# Patient Record
Sex: Male | Born: 1965 | Race: White | Hispanic: No | Marital: Married | Smoking: Former smoker
Health system: Southern US, Community
[De-identification: ages and names within clinical notes are randomized; demographics above are authoritative.]

## PROBLEM LIST (undated history)

## (undated) HISTORY — PX: LEG SURGERY: SHX1003

---

## 2014-08-29 ENCOUNTER — Emergency Department (HOSPITAL_COMMUNITY)
Admission: EM | Admit: 2014-08-29 | Discharge: 2014-08-29 | Disposition: A | Discharge status: Home / Self Care | Payer: 59 | Attending: Emergency Medicine | Admitting: Emergency Medicine

## 2014-08-29 ENCOUNTER — Encounter (HOSPITAL_COMMUNITY): Payer: Self-pay | Admitting: Emergency Medicine

## 2014-08-29 ENCOUNTER — Emergency Department (HOSPITAL_COMMUNITY): Payer: 59

## 2014-08-29 DIAGNOSIS — S92512A Displaced fracture of proximal phalanx of left lesser toe(s), initial encounter for closed fracture: Secondary | ICD-10-CM | POA: Diagnosis not present

## 2014-08-29 DIAGNOSIS — W2201XA Walked into wall, initial encounter: Secondary | ICD-10-CM | POA: Diagnosis not present

## 2014-08-29 DIAGNOSIS — Y9289 Other specified places as the place of occurrence of the external cause: Secondary | ICD-10-CM | POA: Diagnosis not present

## 2014-08-29 DIAGNOSIS — Y998 Other external cause status: Secondary | ICD-10-CM | POA: Insufficient documentation

## 2014-08-29 DIAGNOSIS — Y9389 Activity, other specified: Secondary | ICD-10-CM | POA: Insufficient documentation

## 2014-08-29 DIAGNOSIS — S92502A Displaced unspecified fracture of left lesser toe(s), initial encounter for closed fracture: Secondary | ICD-10-CM

## 2014-08-29 DIAGNOSIS — S99922A Unspecified injury of left foot, initial encounter: Secondary | ICD-10-CM | POA: Diagnosis present

## 2014-08-29 MED ORDER — CEPHALEXIN 500 MG PO CAPS: 500.0000 mg | ORAL_CAPSULE | Freq: Two times a day (BID) | ORAL | Status: DC

## 2014-08-29 NOTE — ED Notes (Addendum)
Pt states that he was trying to get his kids to school yesterday and accidentally kicked a wall with his rt foot.  Rt second digit swelling, redness, bruising, and pain.  Pt ambulatory to room w/o limp.

## 2014-08-29 NOTE — Discharge Instructions (Signed)
You have an avulsion fracture at the base of your third toe.  Please follow instruction below for care.  You do have an abrasion to your toe, if after the bruising subside but you notice signs of infection including increasing pain, warm to the touch, pus drainage then take Keflex as prescribed for the full duration.  Take ibuprofen or tylenol as needed for pain.  Wear Postop shoe for stability and support.  Return if your condition worsen.  Toe Fracture Your caregiver has diagnosed you as having a fractured toe. A toe fracture is a break in the bone of a toe. "Buddy taping" is a way of splinting your broken toe, by taping the broken toe to the toe next to it. This "buddy taping" will keep the injured toe from moving beyond normal range of motion. Buddy taping also helps the toe heal in a more normal alignment. It may take 6 to 8 weeks for the toe injury to heal. HOME CARE INSTRUCTIONS   Leave your toes taped together for as long as directed by your caregiver or until you see a doctor for a follow-up examination. You can change the tape after bathing. Always use a small piece of gauze or cotton between the toes when taping them together. This will help the skin stay dry and prevent infection.  Apply ice to the injury for 15-20 minutes each hour while awake for the first 2 days. Put the ice in a plastic bag and place a towel between the bag of ice and your skin.  After the first 2 days, apply heat to the injured area. Use heat for the next 2 to 3 days. Place a heating pad on the foot or soak the foot in warm water as directed by your caregiver.  Keep your foot elevated as much as possible to lessen swelling.  Wear sturdy, supportive shoes. The shoes should not pinch the toes or fit tightly against the toes.  Your caregiver may prescribe a rigid shoe if your foot is very swollen.  Your may be given crutches if the pain is too great and it hurts too much to walk.  Only take over-the-counter or  prescription medicines for pain, discomfort, or fever as directed by your caregiver.  If your caregiver has given you a follow-up appointment, it is very important to keep that appointment. Not keeping the appointment could result in a chronic or permanent injury, pain, and disability. If there is any problem keeping the appointment, you must call back to this facility for assistance. SEEK MEDICAL CARE IF:   You have increased pain or swelling, not relieved with medications.  The pain does not get better after 1 week.  Your injured toe is cold when the others are warm. SEEK IMMEDIATE MEDICAL CARE IF:   The toe becomes cold, numb, or white.  The toe becomes hot (inflamed) and red. Document Released: 08/27/2000 Document Revised: 11/22/2011 Document Reviewed: 04/15/2008 [facility] Patient Information 2015 [employer] [place]. This information is not intended to replace advice given to you by your health care provider. Make sure you discuss any questions you have with your health care provider.

## 2014-08-29 NOTE — ED Provider Notes (Signed)
CSN: [id number]     Arrival date & time 08/29/14  0915 History   First MD Initiated Contact with Patient 08/29/14 [id number]     Chief Complaint  Patient presents with  . Toe Pain     (Consider location/radiation/quality/duration/timing/severity/associated sxs/prior Treatment) HPI   48 year old male presents for evaluation of toe injury. Patient reports yesterday morning he accidentally stump his third toe on his left foot against the corner of the wall. Report acute onset of sharp pain to the affected area and since then he noticed that his toe is bruised and swollen. Aside from ice and buddy tape no other specific treatment tried. He is not on any blood thinner medication. He denies any other injury. Denies any ankle pain or numbness. Patient has no other complaint.  Pt able to ambulate.    History reviewed. No pertinent past medical history. Past Surgical History  Procedure Laterality Date  . Leg surgery     History reviewed. No pertinent family history. History  Substance Use Topics  . Smoking status: Never Smoker   . Smokeless tobacco: Not on file  . Alcohol Use: No    Review of Systems  Constitutional: Negative for fever.  Skin: Positive for wound.  Neurological: Negative for numbness.      Allergies  Review of patient's allergies indicates no known allergies.  Home Medications   Prior to Admission medications   Not on File   BP 125/83 mmHg  Pulse 62  Temp(Src) 98.2 F (36.8 C) (Oral)  Resp 18  SpO2 100% Physical Exam  Constitutional: He appears well-developed and well-nourished. No distress.  HENT:  Head: Atraumatic.  Eyes: Conjunctivae are normal.  Neck: Normal range of motion. Neck supple.  Musculoskeletal: He exhibits tenderness (L foot: 3rd toe with diffused ecchymosis to PIP and MTP with tenderness to palpation but no crepitus.  brisk cap refill to toe, with erythema extending to distal foot.  no FB noted. small abrasion noted to medial phalanx.  pedal  pulse palpable.  ).  Neurological: He is alert.  Skin: No rash noted.  Psychiatric: He has a normal mood and affect.    ED Course  Procedures (including critical care time)  10:00 AM Patient with an apparent left third toe injury, suspect fracture at the MTP. X-ray ordered, pain medication offered however patient declined. Otherwise patient is neurovascularly intact.  Pt does have a small abrasion to his skin.  There are associated redness to the toe but likely due to bruising.  Low suspicion for cellulitis at this time, however pt made aware to monitor closely and follow up with his PCP if notice signs of infection.    10:19 AM Xray demonstrates a small avulsion fx at the base of the third proximal phalanx.  This is a closed injury.  Toe will be buddy tape, postop shoe provided for stability.  Ortho referral as needed.    Labs Review Labs Reviewed - No data to display  Imaging Review Dg Foot Complete Right  08/29/2014   CLINICAL DATA:  Foot injury yesterday.  Bruising.  Pain third toe  EXAM: RIGHT FOOT COMPLETE - 3+ VIEW  COMPARISON:  None.  FINDINGS: Small avulsion fracture base of the third proximal phalanx which appears acute. No other fracture.  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 Calcaneal spurring  IMPRESSION: Small avulsion fracture base of the third proximal phalanx.   Electronically Signed   By: Marlan Palauharles  Clark M.D.   On:  08/29/2014 10:08     EKG Interpretation None      MDM   Final diagnoses:  Fracture of third toe, left, closed, initial encounter    BP 125/83 mmHg  Pulse 62  Temp(Src) 98.2 F (36.8 C) (Oral)  Resp 18  SpO2 100%  I have reviewed nursing notes and vital signs. I personally reviewed the imaging tests through PACS system  I reviewed available ER/hospitalization records thought the EMR     Fayrene HelperBowie Toneisha Savary, PA-C 08/29/14 1023  Glynn OctaveStephen Rancour, MD 08/29/14 [phone]

## 2014-08-29 NOTE — ED Notes (Signed)
Patient states he hit his right foot on a door jamb yesterday.  Patient's 3rd toe is bruised and swollen.  He was wearing a sock and a slipper when he hit his foot, but he does have an area of broken skin on the medial left third toe.  Patient can wiggle all digits on the foot and is able to ambulate.  Pulses are present in LLE and skin is warm and dry.

## 2015-01-03 ENCOUNTER — Encounter: Payer: Self-pay | Admitting: Internal Medicine

## 2015-02-03 ENCOUNTER — Ambulatory Visit (AMBULATORY_SURGERY_CENTER): Payer: Self-pay | Admitting: *Deleted

## 2015-02-03 VITALS — Ht 73.0 in | Wt 194.2 lb

## 2015-02-03 DIAGNOSIS — Z1211 Encounter for screening for malignant neoplasm of colon: Secondary | ICD-10-CM

## 2015-02-03 MED ORDER — MOVIPREP 100 G PO SOLR: 1.0000 | Freq: Once | ORAL | Status: DC

## 2015-02-03 NOTE — Progress Notes (Signed)
Denies allergies to eggs or soy products. Denies complications with sedation or anesthesia. Denies O2 use. Denies use of diet or weight loss medications.  Emmi instructions given for colonoscopy.  

## 2015-02-17 ENCOUNTER — Encounter: Payer: Self-pay | Admitting: Internal Medicine

## 2015-02-21 ENCOUNTER — Ambulatory Visit (AMBULATORY_SURGERY_CENTER): Payer: 59 | Admitting: Internal Medicine

## 2015-02-21 ENCOUNTER — Encounter: Payer: Self-pay | Admitting: Internal Medicine

## 2015-02-21 VITALS — BP 132/83 | HR 59 | Temp 98.0°F | Resp 18 | Ht 73.0 in | Wt 194.0 lb

## 2015-02-21 DIAGNOSIS — Z1211 Encounter for screening for malignant neoplasm of colon: Secondary | ICD-10-CM

## 2015-02-21 MED ORDER — SODIUM CHLORIDE 0.9 % IV SOLN: 500.0000 mL | INTRAVENOUS | Status: DC

## 2015-02-21 NOTE — Progress Notes (Signed)
No problems noted in the recovery room. maw 

## 2015-02-21 NOTE — Patient Instructions (Addendum)
YOU HAD AN ENDOSCOPIC PROCEDURE TODAY AT THE [facility]:   Refer to the procedure report that was given to you for any specific questions about what was found during the examination.  If the procedure report does not answer your questions, please call your gastroenterologist to clarify.  If you requested that your care partner not be given the details of your procedure findings, then the procedure report has been included in a sealed envelope for you to review at your convenience later.  YOU SHOULD EXPECT: Some feelings of bloating in the abdomen. Passage of more gas than usual.  Walking can help get rid of the air that was put into your GI tract during the procedure and reduce the bloating. If you had a lower endoscopy (such as a colonoscopy or flexible sigmoidoscopy) you may notice spotting of blood in your stool or on the toilet paper. If you underwent a bowel prep for your procedure, you may not have a normal bowel movement for a few days.  Please Note:  You might notice some irritation and congestion in your nose or some drainage.  This is from the oxygen used during your procedure.  There is no need for concern and it should clear up in a day or so.  SYMPTOMS TO REPORT IMMEDIATELY:   Following lower endoscopy (colonoscopy or flexible sigmoidoscopy):  Excessive amounts of blood in the stool  Significant tenderness or worsening of abdominal pains  Swelling of the abdomen that is new, acute  Fever of 100F or higher   For urgent or emergent issues, a gastroenterologist can be reached at any hour by calling [phone].   DIET: Your first meal following the procedure should be a small meal and then it is ok to progress to your normal diet. Heavy or fried foods are harder to digest and may make you feel nauseous or bloated.  Likewise, meals heavy in dairy and vegetables can increase bloating.  Drink plenty of fluids but you should avoid alcoholic beverages for 24  hours.  ACTIVITY:  You should plan to take it easy for the rest of today and you should NOT DRIVE or use heavy machinery until tomorrow (because of the sedation medicines used during the test).    FOLLOW UP: Our staff will call the number listed on your records the next business day following your procedure to check on you and address any questions or concerns that you may have regarding the information given to you following your procedure. If we do not reach you, we will leave a message.  However, if you are feeling well and you are not experiencing any problems, there is no need to return our call.  We will assume that you have returned to your regular daily activities without incident.  If any biopsies were taken you will be contacted by phone or by letter within the next 1-3 weeks.  Please call us at [phone] if you have not heard about the biopsies in 3 weeks.    SIGNATURES/CONFIDENTIALITY: You and/or your care partner have signed paperwork which will be entered into your electronic medical record.  These signatures attest to the fact that that the information above on your After Visit Summary has been reviewed and is understood.  Full responsibility of the confidentiality of this discharge information lies with you and/or your care-partner.    Handouts were given to your care partner on  diverticulosis and a high fiber diet with liberal fluid intake You may resume   your current medications today. Please call if any questions or concerns.   

## 2015-02-21 NOTE — Progress Notes (Signed)
Pt. Prefers no sedation. I will stand by should patient change his mind.

## 2015-02-21 NOTE — Progress Notes (Signed)
To recovery, report to Willis, RN, VSS 

## 2015-02-21 NOTE — Op Note (Signed)
[facility] [address] [place], [address]   COLONOSCOPY PROCEDURE REPORT  PATIENT: Reginald Buchanan, Reginald Buchanan  MR#: [id number] BIRTHDATE: [birth year] , 48  yrs. old GENDER: male ENDOSCOPIST: Roxy Cedar, MD REFERRED BY:.Direct Self PROCEDURE DATE:  02/21/2015 PROCEDURE:   Colonoscopy, screening First Screening Colonoscopy - Avg.  risk and is 50 yrs.  old or older Yes.  Prior Negative Screening - Now for repeat screening. N/A  History of Adenoma - Now for follow-up colonoscopy & has been > or = to 3 yrs.  N/A  Recommend repeat exam, <10 yrs? No ASA CLASS:   Class I INDICATIONS:Screening for colonic neoplasia and Colorectal Neoplasm Risk Assessment for this procedure is average risk. MEDICATIONS: no sedation per patient request  DESCRIPTION OF PROCEDURE:   After the risks benefits and alternatives of the procedure were thoroughly explained, informed consent was obtained.  The digital rectal exam revealed no abnormalities of the rectum.   The LB [id number]  endoscope was introduced through the anus and advanced to the cecum, which was identified by both the appendix and ileocecal valve. No adverse events experienced.   The quality of the prep was excellent. (MoviPrep was used)  The instrument was then slowly withdrawn as the colon was fully examined. Estimated blood loss is zero unless otherwise noted in this procedure report.      COLON FINDINGS: There was mild diverticulosis, a few tiny diverticula, in the sigmoid colon.   The examination was otherwise normal. No polyps, inflammation, or other abnormalities.. Retroflexed views revealed internal hemorrhoids. The time to cecum = 3.1 Withdrawal time = 10.3   The scope was withdrawn and the procedure completed.  COMPLICATIONS: There were no immediate complications.  ENDOSCOPIC IMPRESSION: 1.   Mild diverticulosis was noted in the sigmoid colon 2.   The examination was otherwise  normal  RECOMMENDATIONS: 1. Continue current colorectal screening recommendations for "routine risk" patients with a repeat colonoscopy in 10 years.  eSigned:  Roxy Cedar, MD 02/21/2015 11:34 AM   cc: The Patient   ; Alma Couzens,MD

## 2015-02-24 ENCOUNTER — Telehealth: Payer: Self-pay | Admitting: Emergency Medicine

## 2015-02-24 NOTE — Telephone Encounter (Signed)
identifier noted, left message f/u

## 2015-02-26 ENCOUNTER — Encounter: Payer: Self-pay | Admitting: *Deleted

## 2015-02-28 ENCOUNTER — Ambulatory Visit (INDEPENDENT_AMBULATORY_CARE_PROVIDER_SITE_OTHER): Payer: 59 | Admitting: Cardiology

## 2015-02-28 ENCOUNTER — Encounter: Payer: Self-pay | Admitting: Cardiology

## 2015-02-28 VITALS — BP 108/80 | HR 51 | Ht 73.0 in | Wt 186.4 lb

## 2015-02-28 DIAGNOSIS — R001 Bradycardia, unspecified: Secondary | ICD-10-CM | POA: Diagnosis not present

## 2015-02-28 DIAGNOSIS — R0609 Other forms of dyspnea: Secondary | ICD-10-CM

## 2015-02-28 NOTE — Patient Instructions (Signed)
Medication Instructions:  None  Labwork: None  Testing/Procedures: Your physician has requested that you have an exercise tolerance test. For further information please visit [email or web address] Please also follow instruction sheet, as given.   Your physician has requested that you have an echocardiogram. Echocardiography is a painless test that uses sound waves to create images of your heart. It provides your doctor with information about the size and shape of your heart and how well your heart's chambers and valves are working. This procedure takes approximately one hour. There are no restrictions for this procedure.  Follow-Up: Your physician recommends that you schedule a follow-up appointment AS NEEDED with Dr. Mayford Knife pending your study results.  Any Other Special Instructions Will Be Listed Below (If Applicable).

## 2015-02-28 NOTE — Progress Notes (Signed)
Cardiology Office Note   Date:  02/28/2015   ID:  Reginald Buchanan, DOB 18-Aug-1966, MRN [id number]  PCP:  Manson Passey, MD    No chief complaint on file.     History of Present Illness: Reginald Buchanan is a 49 y.o. male who presents for evaluation of SOB.  He says that he walks 10 miles a day but when he tries to run he has to stop after running a 1/4 mile because he gets SOB and has to go back to walking.  He can bike for 40 miles at a normal pace but going up a hill he has to slow down due to SOB.  He has always been a walker and has just recently started to run.  He has never run before.  He denies any chest pain or pressure.  He denies any LE edema, dizziness, palpitations, weakness, fatigue or syncope.  He is a former smoker up to 1ppd for 20 year.  He is adopted so he does not know his family history.      History reviewed. No pertinent past medical history.  Past Surgical History  Procedure Laterality Date  . Leg surgery       No current outpatient prescriptions on file.   No current facility-administered medications for this visit.    Allergies:   Review of patient's allergies indicates no known allergies.    Social History:  The patient  reports that he quit smoking about 3 years ago. He has never used smokeless tobacco. He reports that he does not drink alcohol or use illicit drugs.   Family History:  The patient's family history is negative for Colon cancer. He was adopted.    ROS:  Please see the history of present illness.   Otherwise, review of systems are positive for none.   All other systems are reviewed and negative.    PHYSICAL EXAM: VS:  BP 108/80 mmHg  Pulse 51  Ht  (1.854 m)  Wt 186 lb 6.4 oz (84.55 kg)  BMI 24.60 kg/m2 , BMI Body mass index is 24.6 kg/(m^2). GEN: Well nourished, well developed, in no acute distress HEENT: normal Neck: no JVD, carotid bruits, or masses Cardiac: RRR; no murmurs, rubs, or gallops,no edema    Respiratory:  clear to auscultation bilaterally, normal work of breathing GI: soft, nontender, nondistended, + BS MS: no deformity or atrophy Skin: warm and dry, no rash Neuro:  Strength and sensation are intact Psych: euthymic mood, full affect   EKG:  EKG is ordered today. The ekg ordered today demonstrates sinus bradycardia at 51bpm with no ST changes   Recent Labs: No results found for requested labs within last 365 days.    Lipid Panel No results found for: CHOL, TRIG, HDL, CHOLHDL, VLDL, LDLCALC, LDLDIRECT    Wt Readings from Last 3 Encounters:  02/28/15 186 lb 6.4 oz (84.55 kg)  02/21/15 194 lb (87.998 kg)  02/03/15 194 lb 3.2 oz (88.089 kg)       ASSESSMENT AND PLAN:  1.  DOE that occurs only with running.  He can walk 20 miles and bike with no problems.  He just started running and that is when he started noticing the SOB.  He denies any chest pain.  His EKG is normal.  His only CRF is remote history of smoking.  I will get an ETT to rule out CAD and assess  his chronotropic response to exercise.  I will check a 2D echo to assess LVF.  I have stressed the importance to him of establishing with a PCP for routine medical care 2.  Asymptomatic bradycardia   Current medicines are reviewed at length with the patient today.  The patient does not have concerns regarding medicines.  The following changes have been made:  no change  Labs/ tests ordered today: See above Assessment and Plan No orders of the defined types were placed in this encounter.     Disposition:   FU with me PRN pending results of studies. Harlon Flor, MD  02/28/2015 9:25 AM    [facility] [address] [place], [place], [place]  [address] Phone: [phone]; Fax: [phone]

## 2015-03-05 ENCOUNTER — Other Ambulatory Visit: Payer: Self-pay

## 2015-03-05 ENCOUNTER — Ambulatory Visit (HOSPITAL_COMMUNITY): Payer: 59 | Attending: Cardiology

## 2015-03-05 DIAGNOSIS — R0609 Other forms of dyspnea: Secondary | ICD-10-CM | POA: Diagnosis not present

## 2015-04-01 ENCOUNTER — Other Ambulatory Visit: Payer: Self-pay | Admitting: Internal Medicine

## 2015-04-04 ENCOUNTER — Encounter: Payer: 59 | Admitting: Nurse Practitioner

## 2015-04-04 ENCOUNTER — Ambulatory Visit (INDEPENDENT_AMBULATORY_CARE_PROVIDER_SITE_OTHER): Payer: 59

## 2015-04-04 DIAGNOSIS — R0609 Other forms of dyspnea: Secondary | ICD-10-CM

## 2015-04-04 LAB — EXERCISE TOLERANCE TEST
CHL CUP MPHR: 170 {beats}/min
CHL CUP RESTING HR STRESS: 56 {beats}/min
CSEPED: 13 min
CSEPHR: 100 %
Estimated workload: 15.3 METS
Exercise duration (sec): 0 s
Peak HR: 171 {beats}/min
RPE: 13

## 2015-06-30 ENCOUNTER — Encounter: Payer: Self-pay | Admitting: Pulmonary Disease

## 2015-06-30 ENCOUNTER — Ambulatory Visit (INDEPENDENT_AMBULATORY_CARE_PROVIDER_SITE_OTHER): Payer: 59 | Admitting: Pulmonary Disease

## 2015-06-30 ENCOUNTER — Ambulatory Visit (INDEPENDENT_AMBULATORY_CARE_PROVIDER_SITE_OTHER)
Admission: RE | Admit: 2015-06-30 | Discharge: 2015-06-30 | Disposition: A | Discharge status: Home / Self Care | Payer: 59 | Source: Ambulatory Visit | Attending: Pulmonary Disease | Admitting: Pulmonary Disease

## 2015-06-30 VITALS — BP 118/78 | HR 61 | Ht 73.0 in | Wt 185.2 lb

## 2015-06-30 DIAGNOSIS — R0600 Dyspnea, unspecified: Secondary | ICD-10-CM | POA: Insufficient documentation

## 2015-06-30 DIAGNOSIS — J449 Chronic obstructive pulmonary disease, unspecified: Secondary | ICD-10-CM

## 2015-06-30 NOTE — Progress Notes (Signed)
   Subjective:    Patient ID: Reginald Buchanan, male    DOB: 09/26/1965, 49 y.o.   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 He is husband of Dr. Elisabeth Pigeonevine. He smoked about a pack every 3 days until 2005-about 10 pack years. He quit and then started smoking cigars for another 4 years before he finally quit altogether. He also reports exposure to secondhand smoke as a child since both his parents were smokers. He is concerned that he may have COPD. He has started a fitness program since February 2016 and has increased in number of steps on his fitbit to about 40,000/day. [place] distances. He is concerned that he is limited in his exercise capacity. He denies wheezing or frequent chest colds or cough.  He underwent a cardiac evaluation-echo showed normal LV function. Spirometry-showed no evidence of airway obstruction, FEV1 was 80% -3.1 L, FVC was 77% suggesting mild restriction CXR - did not show any evidence of coronary pulmonary disease  He leads a very healthy lifestyle and has a good diet and exercise regimen Father had colon cancer and mother had uterine cancer. There is no history of lung cancer in the family   No past medical history on file.  Past Surgical History  Procedure Laterality Date  . Leg surgery      No Known Allergies  Social History   Social History  . Marital Status: Married    Spouse Name: N/A  . Number of Children: N/A  . Years of Education: N/A   Occupational History  . Not on file.   Social History Main Topics  . Smoking status: Former Smoker    Quit date: 09/14/2011  . Smokeless tobacco: Never Used  . Alcohol Use: No  . Drug Use: No  . Sexual Activity: Not on file   Other Topics Concern  . Not on file   Social History Narrative    Family History  Problem Relation Age of Onset  . Adopted: Yes  . Colon cancer Neg Hx         Review of Systems  Constitutional: Negative for fever and unexpected weight change.  HENT: Positive for sinus pressure. Negative for congestion, dental problem, ear pain, nosebleeds, postnasal drip, rhinorrhea, sneezing, sore throat and trouble swallowing.   Eyes: Negative for redness and itching.  Respiratory: Positive for shortness of breath. Negative for cough, chest tightness and wheezing.   Cardiovascular: Negative for palpitations and leg swelling.  Gastrointestinal: Negative for nausea and vomiting.  Genitourinary: Negative for dysuria.  Musculoskeletal: Negative for joint swelling.  Skin: Negative for rash.  Neurological: Negative for headaches.  Hematological: Does not bruise/bleed easily.  Psychiatric/Behavioral: Negative for dysphoric mood. The patient is not nervous/anxious.        Objective:   Physical Exam  Gen. Pleasant, well-nourished, tall man in no distress, normal affect ENT - no lesions, no post nasal drip Neck: No JVD, no thyromegaly, no carotid bruits Lungs: no use of accessory muscles, no dullness to percussion, clear without rales or rhonchi  Cardiovascular: Rhythm regular, heart sounds  normal, no murmurs or gallops, no peripheral edema Abdomen: soft and non-tender, no hepatosplenomegaly, BS normal. Musculoskeletal: No deformities, no cyanosis or clubbing Neuro:  alert, non focal       Assessment & Plan:

## 2015-06-30 NOTE — Patient Instructions (Addendum)
No evidence of COPD Lung capacity is at 80% - we can repeat in 6 months CXR today

## 2015-06-30 NOTE — Assessment & Plan Note (Addendum)
No evidence of COPD Lung capacity is at 80% - we can repeat in 6 months I doubt that this is related to restrictive lung disease. He has no cause for restriction-no chest wall deformity or intraparenchymal disease. I feel that this may simply represent exposure to secondhand smoke as a growing child and probably never achieved a peak lung function.  We'll repeat spirometry in 6 months to ensure that there is no decline. Otherwise I reassured him, he can continue to engage in exercise without limitations. We also discussed benefits of lung cancer screening- he is not a candidate with current criteria

## 2015-07-03 ENCOUNTER — Telehealth: Payer: Self-pay | Admitting: Pulmonary Disease

## 2015-07-03 DIAGNOSIS — J449 Chronic obstructive pulmonary disease, unspecified: Secondary | ICD-10-CM

## 2015-07-03 NOTE — Telephone Encounter (Signed)
Result Notes     Notes Recorded by Karleen HampshireMichelle P Gay, CMA on 07/03/2015 at 1:32 PM lmtcb ------  Notes Recorded by Oretha Milchakesh Alva V, MD on 06/30/2015 at 5:17 PM X-ray is clear   Spoke with pt. He is aware of his results. Wants to know more about how his lungs look. Do they look like the lungs of a former smoker? He would also like to repeat his spirometry, feels like he got a false reading.  RA - please advise.

## 2015-07-04 NOTE — Telephone Encounter (Signed)
RA - pls advise

## 2015-07-07 NOTE — Telephone Encounter (Signed)
[phone], pt cb

## 2015-07-07 NOTE — Telephone Encounter (Signed)
His lungs look good I have also spoken to his iwfe OK to repeat spirometry in 3 months

## 2015-07-07 NOTE — Telephone Encounter (Signed)
Spoke w/ pt and is aware we are awaiting response to questions below.  Please advise thanks

## 2015-07-08 NOTE — Telephone Encounter (Signed)
Telephone encounter has been closed ----------------------------------------------- RA- do you want me to schedule pt to come in to do Spirometry with Tammy in 3 months or do you want pt to come back for a follow up appt and do Spirometry in office?  Your last OV states pt does not need follow up for 6 months (in April)  Please advise.

## 2015-07-09 ENCOUNTER — Telehealth: Payer: Self-pay | Admitting: Pulmonary Disease

## 2015-07-09 NOTE — Telephone Encounter (Signed)
Patient returned call, [phone] or [phone] after 4:00 today.

## 2015-07-09 NOTE — Telephone Encounter (Signed)
Left message for pt to call back  °

## 2015-07-09 NOTE — Telephone Encounter (Signed)
OK to simply repeat spirometry & we can call him with results

## 2015-07-09 NOTE — Telephone Encounter (Signed)
Call Documentation      Oretha Milchakesh Alva V, MD at 07/09/2015 12:32 PM     Status: Signed       Expand All Collapse All   OK to simply repeat spirometry & we can call him with results            Karleen HampshireMichelle P Gay, CMA at 07/08/2015 2:23 PM     Status: Signed       Expand All Collapse All   Telephone encounter has been closed ----------------------------------------------- RA- do you want me to schedule pt to come in to do Spirometry with Tammy in 3 months or do you want pt to come back for a follow up appt and do Spirometry in office? Your last OV states pt does not need follow up for 6 months (in April) Please advise.        lmtcb x2

## 2015-07-10 NOTE — Telephone Encounter (Signed)
lmomtcb x 2  

## 2015-07-10 NOTE — Addendum Note (Signed)
Addended by: York RamGAY, Lennyn Gange on: 07/10/2015 08:54 AM   Modules accepted: Orders

## 2015-07-10 NOTE — Telephone Encounter (Signed)
Spoke with patient and [facility]. Nothing further needed.

## 2015-07-10 NOTE — Telephone Encounter (Signed)
Ordered Spirometry. Called patient to schedule appt.  Left detailed message on voicemail advising patient to call our office to schedule Spirometry in 3 months.   Hold in box until scheduled.

## 2015-07-11 NOTE — Telephone Encounter (Signed)
lmomtcb for pt 

## 2015-07-14 NOTE — Telephone Encounter (Signed)
We have not heard back from the pt. Will close message per triage protocol.

## 2015-10-24 ENCOUNTER — Other Ambulatory Visit: Payer: Self-pay | Admitting: Pulmonary Disease

## 2015-10-24 DIAGNOSIS — R0600 Dyspnea, unspecified: Secondary | ICD-10-CM

## 2015-10-27 ENCOUNTER — Ambulatory Visit (INDEPENDENT_AMBULATORY_CARE_PROVIDER_SITE_OTHER): Payer: 59 | Admitting: Pulmonary Disease

## 2015-10-27 DIAGNOSIS — R0600 Dyspnea, unspecified: Secondary | ICD-10-CM

## 2015-10-27 LAB — PULMONARY FUNCTION TEST
FEF 25-75 Pre: 3.26 L/sec
FEF2575-%Pred-Pre: 85 %
FEV1-%Pred-Pre: 78 %
FEV1-Pre: 3.47 L
FEV1FVC-%Pred-Pre: 103 %
FEV6-%PRED-PRE: 78 %
FEV6-Pre: 4.35 L
FEV6FVC-%PRED-PRE: 103 %
FVC-%Pred-Pre: 76 %
FVC-Pre: 4.36 L
PRE FEV1/FVC RATIO: 79 %
PRE FEV6/FVC RATIO: 100 %

## 2015-10-27 NOTE — Progress Notes (Signed)
Spirometry done today. 

## 2015-10-30 ENCOUNTER — Telehealth: Payer: Self-pay | Admitting: Pulmonary Disease

## 2015-10-30 NOTE — Telephone Encounter (Signed)
Notes Recorded by Karleen Hampshire, CMA on 10/29/2015 at 5:08 PM Left message for patient to call back.  Notes Recorded by Oretha Milch, MD on 10/29/2015 at 1:17 PM Spirometry numbers are unchanged from prior Again no obstruction-no evidence of COPD   Pt's wife is aware of PFT results. Nothing further was needed at this time.

## 2015-12-31 ENCOUNTER — Ambulatory Visit: Payer: 59 | Admitting: Pulmonary Disease

## 2016-06-21 IMAGING — DX DG CHEST 2V
2 series · 2 of 2 positions shown · non-contrast
Comparison: None.

CLINICAL DATA: Shortness of breath

EXAM:
CHEST  2 VIEW

[chest pa]
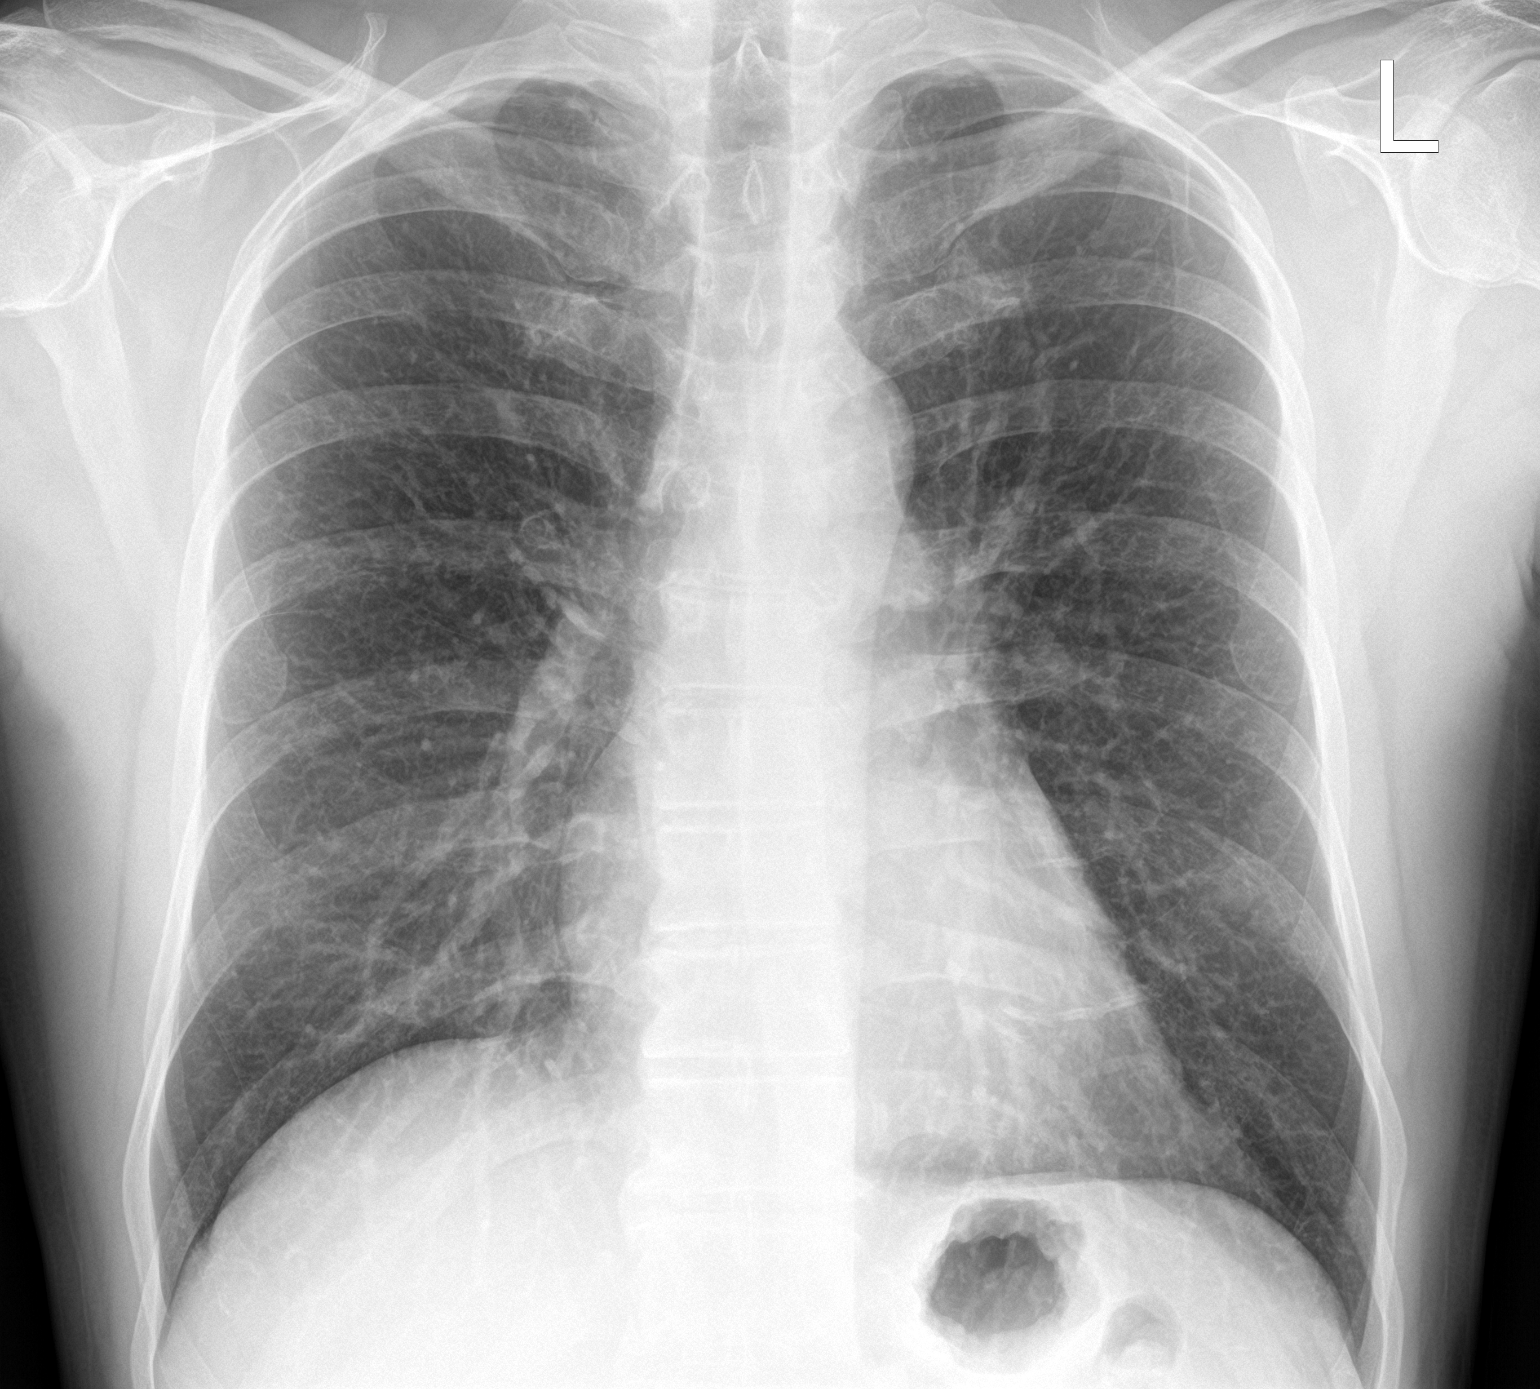

[chest lat]
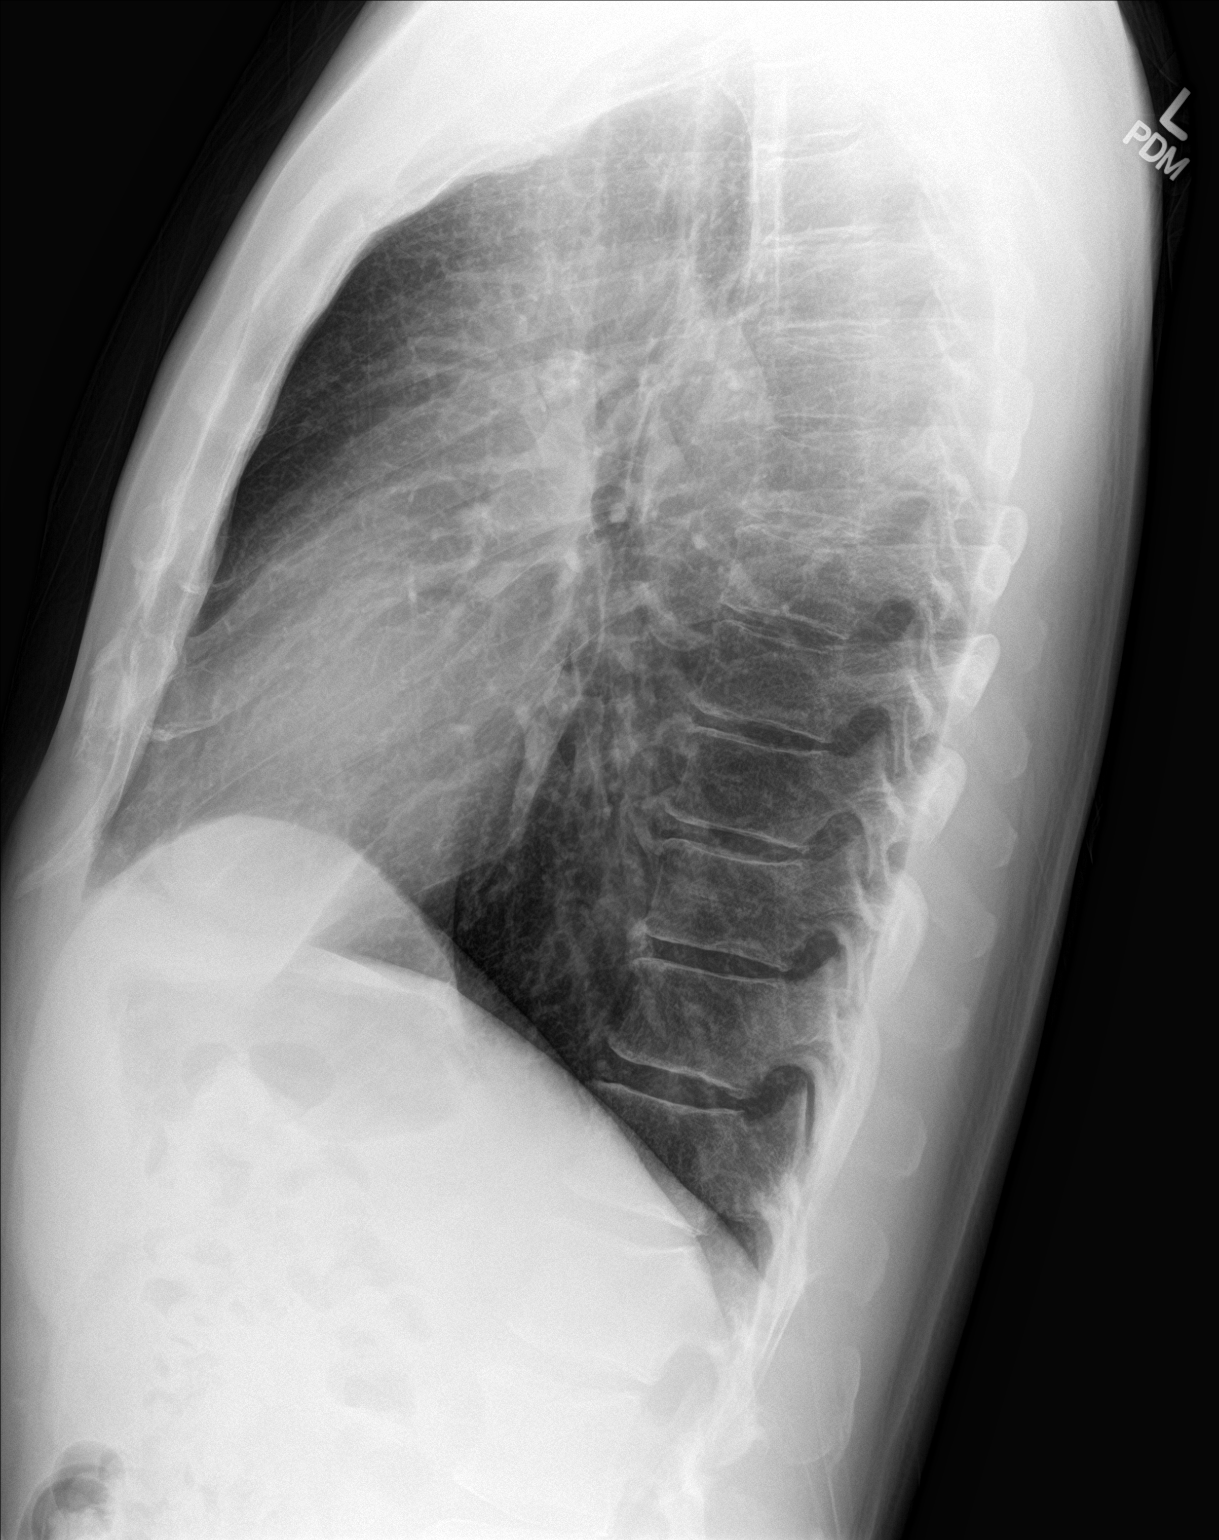

[2 of 2 positions shown; findings below may reference images not displayed]

FINDINGS: The heart size and mediastinal contours are within normal limits.
Both lungs are clear. The visualized skeletal structures are
unremarkable.
IMPRESSION: No active cardiopulmonary disease.
